# Patient Record
Sex: Male | Born: 1950
Health system: Southern US, Community
[De-identification: ages and names within clinical notes are randomized; demographics above are authoritative.]

## PROBLEM LIST (undated history)

## (undated) DIAGNOSIS — I10 Essential (primary) hypertension: Secondary | ICD-10-CM

## (undated) DIAGNOSIS — M1990 Unspecified osteoarthritis, unspecified site: Secondary | ICD-10-CM

## (undated) DIAGNOSIS — R000 Tachycardia, unspecified: Secondary | ICD-10-CM

## (undated) HISTORY — PX: KNEE SURGERY: SHX244

## (undated) HISTORY — PX: APPENDECTOMY: SHX54

---

## 2017-06-27 ENCOUNTER — Encounter (HOSPITAL_BASED_OUTPATIENT_CLINIC_OR_DEPARTMENT_OTHER): Payer: Self-pay | Admitting: *Deleted

## 2017-06-27 ENCOUNTER — Other Ambulatory Visit: Payer: Self-pay

## 2017-06-27 ENCOUNTER — Emergency Department (HOSPITAL_BASED_OUTPATIENT_CLINIC_OR_DEPARTMENT_OTHER)
Admission: EM | Admit: 2017-06-27 | Discharge: 2017-06-27 | Disposition: A | Discharge status: Home / Self Care | Payer: Medicare HMO | Attending: Emergency Medicine | Admitting: Emergency Medicine

## 2017-06-27 DIAGNOSIS — I10 Essential (primary) hypertension: Secondary | ICD-10-CM | POA: Insufficient documentation

## 2017-06-27 DIAGNOSIS — Z79899 Other long term (current) drug therapy: Secondary | ICD-10-CM | POA: Diagnosis not present

## 2017-06-27 HISTORY — DX: Tachycardia, unspecified: R00.0

## 2017-06-27 NOTE — ED Triage Notes (Signed)
Patient states five days ago he went to see a Engineer, petroleumplastic surgeon about a left axillary cyst.  Was found to have a systolic blood pressure of 200.  States the surgeon would not perform the procedure.  States he currently does not have a PCP.  Continued to monitor his bp thru out the week and has decreased thru out the week.  Denies chest pain or sob.  B/P today 161/65.

## 2017-06-27 NOTE — ED Provider Notes (Signed)
[facility] EMERGENCY DEPARTMENT Provider Note   CSN: [id number] Arrival date & time: 06/27/17  1648     History   Chief Complaint Chief Complaint  Patient presents with  . Hypertension    HPI James Key is a 66 y.o. male.  Patient is a 66 year old male who presents with high blood pressure.  He states that he was seen in a plastic surgeon's office to have a cyst removal for his hidradenitis.  At that point his blood pressure was in the 200 range.  He states he came in here to have it rechecked.  The plastic surgeon would not clear him for surgery until he had it evaluated.  He does have a history of elevated blood pressures.  He states his blood pressure normally runs in the 150 range systolically.  He denies any symptoms.  No chest pain, no shortness of breath, no headaches, no dizziness.  He is [place] by [facility].  He states that he has sometimes palpitations for which she takes Cardizem.  He does not know if this is atrial fibrillation.  He states he has been taking his Cardizem regularly.  He feels like he also takes it for his blood pressure.  He has not changed his dose recently.  He has not missed any doses.      Past Medical History:  Diagnosis Date  . Sinus tachycardia     There are no active problems to display for this patient.   Past Surgical History:  Procedure Laterality Date  . APPENDECTOMY         Home Medications    Prior to Admission medications   Medication Sig Start Date End Date Taking? Authorizing Provider  diltiazem (CARDIZEM CD) 360 MG 24 hr capsule Take 360 mg by mouth daily.   Yes [provider]    Family History No family history on file.  Social History Social History   Tobacco Use  . Smoking status: Never Smoker  . Smokeless tobacco: Never Used  Substance Use Topics  . Alcohol use: No    Frequency: Never  . Drug use: No     Allergies   Patient has no known  allergies.   Review of Systems Review of Systems  Constitutional: Negative for chills, diaphoresis, fatigue and fever.  HENT: Negative for congestion, rhinorrhea and sneezing.   Eyes: Negative.   Respiratory: Negative for cough, chest tightness and shortness of breath.   Cardiovascular: Negative for chest pain and leg swelling.  Gastrointestinal: Negative for abdominal pain, blood in stool, diarrhea, nausea and vomiting.  Genitourinary: Negative for difficulty urinating, flank pain, frequency and hematuria.  Musculoskeletal: Negative for arthralgias and back pain.  Skin: Negative for rash.  Neurological: Negative for dizziness, speech difficulty, weakness, numbness and headaches.     Physical Exam Updated Vital Signs BP (!) 156/65 (BP Location: Right Arm)   Pulse 70   Temp 97.9 F (36.6 C) (Oral)   Resp 18   Ht 6' (1.829 m)   Wt 106.6 kg (235 lb)   SpO2 100%   BMI 31.87 kg/m   Physical Exam  Constitutional: He is oriented to person, place, and time. He appears well-developed and well-nourished.  HENT:  Head: Normocephalic and atraumatic.  Eyes: Pupils are equal, round, and reactive to light.  Neck: Normal range of motion. Neck supple.  Cardiovascular: Normal rate, regular rhythm and normal heart sounds.  Pulmonary/Chest: Effort normal and breath sounds normal. No respiratory distress. He has  no wheezes. He has no rales. He exhibits no tenderness.  Abdominal: Soft. Bowel sounds are normal. There is no tenderness. There is no rebound and no guarding.  Musculoskeletal: Normal range of motion. He exhibits no edema.  Lymphadenopathy:    He has no cervical adenopathy.  Neurological: He is alert and oriented to person, place, and time.  Skin: Skin is warm and dry. No rash noted.  Psychiatric: He has a normal mood and affect.     ED Treatments / Results  Labs (all labs ordered are listed, but only abnormal results are displayed) Labs Reviewed - No data to display  EKG   EKG Interpretation  Date/Time:  Saturday June 27 2017 19:13:52 EST Ventricular Rate:  64 PR Interval:    QRS Duration: 93 QT Interval:  393 QTC Calculation: 406 R Axis:   -36 Text Interpretation:  Sinus rhythm Left axis deviation No old tracing to compare Confirmed by Rolan BuccoBelfi, Dareon Nunziato [phone]([id number]) on 06/27/2017 7:33:56 PM       Radiology No results found.  Procedures Procedures (including critical care time)  Medications Ordered in ED Medications - No data to display   Initial Impression / Assessment and Plan / ED Course  I have reviewed the triage vital signs and the nursing notes.  Pertinent labs & imaging results that were available during my care of the patient were reviewed by me and considered in my medical decision making (see chart for details).     Patient has symptomatic hypertension.  His blood pressure has improved in the emergency department.  His EKG is non-concerning.  I encouraged him to follow-up with his cardiologist next week.  Return precautions were given.  Final Clinical Impressions(s) / ED Diagnoses   Final diagnoses:  Hypertension, unspecified type    ED Discharge Orders    None       Rolan BuccoBelfi, Neziah Vogelgesang, MD 06/27/17 [phone]

## 2017-06-27 NOTE — ED Notes (Signed)
Pt on cardiac monitor and auto VS 

## 2017-06-27 NOTE — ED Notes (Signed)
Pt on monitor when entered room

## 2017-06-29 ENCOUNTER — Ambulatory Visit (HOSPITAL_BASED_OUTPATIENT_CLINIC_OR_DEPARTMENT_OTHER)
Admission: RE | Admit: 2017-06-29 | Payer: Commercial Managed Care - HMO | Source: Ambulatory Visit | Admitting: Specialist

## 2017-06-29 ENCOUNTER — Encounter (HOSPITAL_BASED_OUTPATIENT_CLINIC_OR_DEPARTMENT_OTHER): Admission: RE | Payer: Self-pay | Source: Ambulatory Visit

## 2017-06-29 SURGERY — EXCISION, HIDRADENITIS, AXILLA
Anesthesia: General | Laterality: Left

## 2017-07-20 ENCOUNTER — Emergency Department (HOSPITAL_BASED_OUTPATIENT_CLINIC_OR_DEPARTMENT_OTHER)
Admission: EM | Admit: 2017-07-20 | Discharge: 2017-07-20 | Disposition: A | Discharge status: Home / Self Care | Payer: Medicare HMO | Attending: Emergency Medicine | Admitting: Emergency Medicine

## 2017-07-20 ENCOUNTER — Encounter (HOSPITAL_BASED_OUTPATIENT_CLINIC_OR_DEPARTMENT_OTHER): Payer: Self-pay | Admitting: Adult Health

## 2017-07-20 ENCOUNTER — Emergency Department (HOSPITAL_BASED_OUTPATIENT_CLINIC_OR_DEPARTMENT_OTHER): Payer: Medicare HMO

## 2017-07-20 ENCOUNTER — Other Ambulatory Visit: Payer: Self-pay

## 2017-07-20 DIAGNOSIS — W108XXA Fall (on) (from) other stairs and steps, initial encounter: Secondary | ICD-10-CM | POA: Diagnosis not present

## 2017-07-20 DIAGNOSIS — S8991XA Unspecified injury of right lower leg, initial encounter: Secondary | ICD-10-CM | POA: Diagnosis present

## 2017-07-20 DIAGNOSIS — S8391XA Sprain of unspecified site of right knee, initial encounter: Secondary | ICD-10-CM | POA: Diagnosis not present

## 2017-07-20 DIAGNOSIS — Y999 Unspecified external cause status: Secondary | ICD-10-CM | POA: Diagnosis not present

## 2017-07-20 DIAGNOSIS — Z79899 Other long term (current) drug therapy: Secondary | ICD-10-CM | POA: Insufficient documentation

## 2017-07-20 DIAGNOSIS — Y9389 Activity, other specified: Secondary | ICD-10-CM | POA: Diagnosis not present

## 2017-07-20 DIAGNOSIS — Y92008 Other place in unspecified non-institutional (private) residence as the place of occurrence of the external cause: Secondary | ICD-10-CM | POA: Insufficient documentation

## 2017-07-20 DIAGNOSIS — M25561 Pain in right knee: Secondary | ICD-10-CM

## 2017-07-20 MED ORDER — NAPROXEN 500 MG PO TABS: 500.0000 mg | ORAL_TABLET | Freq: Two times a day (BID) | ORAL | 0 refills | Status: DC

## 2017-07-20 NOTE — Discharge Instructions (Signed)
Please read attached information regarding your condition. Wear knee sleeve as directed. Take naproxen as needed for pain and inflammation. Follow-up with orthopedic specialist for further evaluation. Return to ED for worsening symptoms, red hot or tender joint, numbness in legs, additional falls or injuries.

## 2017-07-20 NOTE — ED Provider Notes (Signed)
[facility] EMERGENCY DEPARTMENT Provider Note   CSN: [id number] Arrival date & time: 07/20/17  1503     History   Chief Complaint Chief Complaint  Patient presents with  . Knee Injury    HPI James Key is a 66 y.o. male who presents to ED for evaluation of 3-hour history of aching, cramping right knee pain.  He states that he tripped over 1 of the steps going up his house when he fell and landed on his right knee.  He has been amatory with pain since the accident as though the knee is having a cramping sensation.  He denies any head injury or loss of consciousness or other injuries.  He is not taking any medications prior to arrival.  No previous fracture, dislocations or procedures in the area.  He denies any fevers, numbness in legs, back pain, red hot or tender joint.  HPI  Past Medical History:  Diagnosis Date  . Sinus tachycardia     There are no active problems to display for this patient.   Past Surgical History:  Procedure Laterality Date  . APPENDECTOMY         Home Medications    Prior to Admission medications   Medication Sig Start Date End Date Taking? Authorizing Provider  diltiazem (CARDIZEM CD) 360 MG 24 hr capsule Take 360 mg by mouth daily.    [provider]  naproxen (NAPROSYN) 500 MG tablet Take 1 tablet (500 mg total) by mouth 2 (two) times daily. 07/20/17   Dietrich PatesKhatri, Susa Bones, PA-C    Family History History reviewed. No pertinent family history.  Social History Social History   Tobacco Use  . Smoking status: Never Smoker  . Smokeless tobacco: Never Used  Substance Use Topics  . Alcohol use: No    Frequency: Never  . Drug use: No     Allergies   Patient has no known allergies.   Review of Systems Review of Systems  Constitutional: Negative for chills and fever.  Eyes: Negative for visual disturbance.  Musculoskeletal: Positive for arthralgias and gait problem. Negative for joint swelling, myalgias and neck  stiffness.  Skin: Negative for rash and wound.  Neurological: Negative for weakness, numbness and headaches.     Physical Exam Updated Vital Signs BP 139/67 (BP Location: Right Arm)   Pulse 68   Temp 98.1 F (36.7 C) (Oral)   Resp 18   Ht 6' (1.829 m)   Wt 106.6 kg (235 lb)   SpO2 100%   BMI 31.87 kg/m   Physical Exam  Constitutional: He appears well-developed and well-nourished. No distress.  HENT:  Head: Normocephalic and atraumatic.  Eyes: Conjunctivae and EOM are normal. No scleral icterus.  Neck: Normal range of motion.  Pulmonary/Chest: Effort normal. No respiratory distress.  Musculoskeletal: Normal range of motion. He exhibits tenderness. He exhibits no edema or deformity.  Tenderness to palpation above the right patella with no visible deformity, color or temperature change noted.  2+ DP pulse noted bilaterally.  Full active and passive range of motion of knee but pain with flexion of the knee.  Able to perform straight leg raise.  Neurological: He is alert.  Skin: No rash noted. He is not diaphoretic.  Psychiatric: He has a normal mood and affect.  Nursing note and vitals reviewed.    ED Treatments / Results  Labs (all labs ordered are listed, but only abnormal results are displayed) Labs Reviewed - No data to display  EKG  EKG  Interpretation None       Radiology Dg Knee Complete 4 Views Right  Result Date: 07/20/2017 CLINICAL DATA:  Fall with pain above the patella and swelling EXAM: RIGHT KNEE - COMPLETE 4+ VIEW COMPARISON:  None. FINDINGS: No fracture or malalignment. No large effusion. Mild spurring of the patella. Nonspecific soft tissue calcifications superior to the patella. Minimal joint effusion. IMPRESSION: No definite acute osseous abnormality. Electronically Signed   By: Jasmine PangKim  Fujinaga M.D.   On: 07/20/2017 15:40    Procedures Procedures (including critical care time)  Medications Ordered in ED Medications - No data to  display   Initial Impression / Assessment and Plan / ED Course  I have reviewed the triage vital signs and the nursing notes.  Pertinent labs & imaging results that were available during my care of the patient were reviewed by me and considered in my medical decision making (see chart for details).     Patient presents to ED for evaluation of right knee pain after falling on his knee a few hours prior to arrival.  States that he tripped over 1 of the steps leading to his house.  He denies any head injury or loss of consciousness.  He is not on blood thinners.  No previous fracture, dislocation or procedure in the area.  There is mild tenderness to palpation noted above the patella with no visible deformity, color or temperature change.  He is overall well-appearing.  He is ambulatory with normal gait here in the ED.  X-ray returned as negative.  I suspect contusion being the cause of his pain or possible ligamentous injury.  I have low suspicion for septic joint or other infectious cause of his pain.  Will give patient knee sleeve, anti-inflammatories and advised him to follow-up with orthopedist for further evaluation.  Patient appears stable for discharge at this time.  Strict return precautions given.  Final Clinical Impressions(s) / ED Diagnoses   Final diagnoses:  Acute pain of right knee  Sprain of right knee, unspecified ligament, initial encounter    ED Discharge Orders        Ordered    naproxen (NAPROSYN) 500 MG tablet  2 times daily     07/20/17 1634     Portions of this note were generated with Dragon dictation software. Dictation errors may occur despite best attempts at proofreading.    Dietrich PatesKhatri, Diane Hanel, PA-C 07/20/17 1639    Maia PlanLong, Joshua G, MD 07/21/17 [phone]

## 2017-07-20 NOTE — ED Triage Notes (Signed)
PResents with right knee and upper leg pain that began after tripping over a step today and landing hands first on concrete. Denies hip pain, pain is described as sore and cramping. He has had trouble extending knee without pain. Pulses intact. No deformity noted.

## 2017-07-20 NOTE — ED Notes (Signed)
ED Provider at bedside. 

## 2019-11-23 ENCOUNTER — Encounter (HOSPITAL_BASED_OUTPATIENT_CLINIC_OR_DEPARTMENT_OTHER): Payer: Self-pay

## 2019-11-23 ENCOUNTER — Other Ambulatory Visit: Payer: Self-pay

## 2019-11-23 ENCOUNTER — Emergency Department (HOSPITAL_BASED_OUTPATIENT_CLINIC_OR_DEPARTMENT_OTHER)
Admission: EM | Admit: 2019-11-23 | Discharge: 2019-11-23 | Disposition: A | Discharge status: Home / Self Care | Payer: Medicare HMO | Attending: Emergency Medicine | Admitting: Emergency Medicine

## 2019-11-23 DIAGNOSIS — I10 Essential (primary) hypertension: Secondary | ICD-10-CM | POA: Insufficient documentation

## 2019-11-23 DIAGNOSIS — Z79899 Other long term (current) drug therapy: Secondary | ICD-10-CM | POA: Insufficient documentation

## 2019-11-23 DIAGNOSIS — R030 Elevated blood-pressure reading, without diagnosis of hypertension: Secondary | ICD-10-CM

## 2019-11-23 DIAGNOSIS — M79674 Pain in right toe(s): Secondary | ICD-10-CM | POA: Insufficient documentation

## 2019-11-23 HISTORY — DX: Unspecified osteoarthritis, unspecified site: M19.90

## 2019-11-23 HISTORY — DX: Essential (primary) hypertension: I10

## 2019-11-23 MED ORDER — NAPROXEN 500 MG PO TABS: 500.0000 mg | ORAL_TABLET | Freq: Two times a day (BID) | ORAL | 0 refills | Status: AC

## 2019-11-23 NOTE — Discharge Instructions (Addendum)
I have prescribed NSAIDS to help with pain, please take 1 tablet twice a day with food to help with your symptoms.   Continue to follow up with Podiatry for your chronic right toe pain.   Follow up with your primary care physician as needed.

## 2019-11-23 NOTE — ED Triage Notes (Addendum)
Pt c/o elevated BP x 1 week-pt states he is med compliant-concerned that increase pain in "right foot big toe" that he is being seen for is contributing to increase in BP-NAD-steady gait-during med hx review pt states he thinks he was dx with arthritis to right great toe

## 2019-11-23 NOTE — ED Notes (Signed)
ED Provider at bedside. 

## 2019-11-23 NOTE — ED Provider Notes (Signed)
[facility] EMERGENCY DEPARTMENT Provider Note   CSN: [id number] Arrival date & time: 11/23/19  1312     History Chief Complaint  Patient presents with  . Hypertension    James Key is a 69 y.o. male.  69 y.o male with a PMH of HTN presents to the ED with a chief complaint of elevated BP and right toe pain x months.  Patient reports he is noted his blood pressure to be slightly elevated at home with a systolic in the 762G, states that he is compliant with his current medication.  He has also been dealing with right great toe pain, which she was previously evaluated by podiatry.  Reports this pain feels like a constant ache, wakes him up in the morning along with exacerbated with lying flat.  He was previously seen by podiatry, was given some inserts but has not had any relieving symptoms.  He has also tried on occasion for his pain without improvement in symptoms.  He does not have any prior history of gout, denies any fever, falls, no chest pain, shortness of breath or headaches.   The history is provided by the patient.  Hypertension       Past Medical History:  Diagnosis Date  . Arthritis   . Hypertension   . Sinus tachycardia     There are no problems to display for this patient.   Past Surgical History:  Procedure Laterality Date  . APPENDECTOMY         No family history on file.  Social History   Tobacco Use  . Smoking status: Never Smoker  . Smokeless tobacco: Never Used  Substance Use Topics  . Alcohol use: No  . Drug use: No    Home Medications Prior to Admission medications   Medication Sig Start Date End Date Taking? Authorizing Provider  diltiazem (CARDIZEM CD) 360 MG 24 hr capsule Take 360 mg by mouth daily.    [provider]  naproxen (NAPROSYN) 500 MG tablet Take 1 tablet (500 mg total) by mouth 2 (two) times daily for 7 days. 11/23/19 11/30/19  Janeece Fitting, PA-C    Allergies    Patient has no known  allergies.  Review of Systems   Review of Systems  Constitutional: Negative for fever.  Musculoskeletal: Positive for arthralgias.    Physical Exam Updated Vital Signs BP (!) 153/63   Pulse 80   Temp 98.3 F (36.8 C) (Oral)   Resp 18   Ht 6' (1.829 m)   Wt 106.1 kg   SpO2 100%   BMI 31.74 kg/m   Physical Exam Vitals and nursing note reviewed.  Constitutional:      Appearance: Normal appearance.  HENT:     Head: Normocephalic and atraumatic.     Nose: Nose normal.     Mouth/Throat:     Mouth: Mucous membranes are moist.  Eyes:     Pupils: Pupils are equal, round, and reactive to light.  Cardiovascular:     Rate and Rhythm: Normal rate.     Pulses:          Dorsalis pedis pulses are 2+ on the right side.       Posterior tibial pulses are 2+ on the right side.  Pulmonary:     Effort: Pulmonary effort is normal.  Abdominal:     General: Abdomen is flat.  Musculoskeletal:        General: Tenderness present.     Cervical back: Normal range of  motion and neck supple.  Feet:     Right foot:     Skin integrity: Callus present. No ulcer, blister, skin breakdown, erythema, warmth, dry skin or fissure.     Toenail Condition: Right toenails are long.     Comments: Pulses present capillary refill is intact, pain at the base of the first metatarsal base.  No erythema, edema, sensation is intact. Skin:    General: Skin is warm and dry.  Neurological:     Mental Status: He is alert and oriented to person, place, and time.     ED Results / Procedures / Treatments   Labs (all labs ordered are listed, but only abnormal results are displayed) Labs Reviewed - No data to display  EKG None  Radiology No results found.  Procedures Procedures (including critical care time)  Medications Ordered in ED Medications - No data to display  ED Course  I have reviewed the triage vital signs and the nursing notes.  Pertinent labs & imaging results that were available during my  care of the patient were reviewed by me and considered in my medical decision making (see chart for details).    MDM Rules/Calculators/A&P   Patient with a past medical history of high blood pressure presents to the ED with complaints of elevated blood pressure along with right great toe pain for several months.  Patient reports he was previously seen by podiatry, received new inserts, states he is have not help with his symptoms, reports his blood pressure has been running with a systolic in the 170s, diastolic level is within normal limits.  He reports compliant with his medication.  According to his review he has seen podiatry approximately 6 days ago, received new inserts, he was diagnosed with hallux rigidus and hallux valgus deformity.  He had podiatry discussed nonoperative and operative treatment.  He is to be reevaluated within 3 months if inserts fail.  We discussed that he will likely need to have his right toe followed by podiatry.  There is no suspicion for gout, no prior history of this, no history of beer consumption, does report meat intake but believes this is less likely.  No trauma, falls.  Blood pressure in the ED is 178/72, no chest pain, shortness of breath, headaches.  Discussed with patient to go home on a short course of anti-inflammatories to help with his toe pain, he is to continue to follow-up with podiatry.  Symptoms no recent management, return precautions discussed at length.    Portions of this note were generated with Scientist, clinical (histocompatibility and immunogenetics). Dictation errors may occur despite best attempts at proofreading.  Final Clinical Impression(s) / ED Diagnoses Final diagnoses:  Elevated blood pressure reading  Great toe pain, right    Rx / DC Orders ED Discharge Orders         Ordered    naproxen (NAPROSYN) 500 MG tablet  2 times daily     11/23/19 1357           Claude Manges, PA-C 11/23/19 1401    Linwood Dibbles, MD 11/23/19 1437

## 2020-01-24 ENCOUNTER — Other Ambulatory Visit: Payer: Self-pay

## 2020-01-24 ENCOUNTER — Emergency Department (HOSPITAL_BASED_OUTPATIENT_CLINIC_OR_DEPARTMENT_OTHER): Payer: Medicare HMO

## 2020-01-24 ENCOUNTER — Emergency Department (HOSPITAL_BASED_OUTPATIENT_CLINIC_OR_DEPARTMENT_OTHER)
Admission: EM | Admit: 2020-01-24 | Discharge: 2020-01-24 | Disposition: A | Discharge status: Home / Self Care | Payer: Medicare HMO | Attending: Emergency Medicine | Admitting: Emergency Medicine

## 2020-01-24 ENCOUNTER — Encounter (HOSPITAL_BASED_OUTPATIENT_CLINIC_OR_DEPARTMENT_OTHER): Payer: Self-pay | Admitting: *Deleted

## 2020-01-24 DIAGNOSIS — M2550 Pain in unspecified joint: Secondary | ICD-10-CM | POA: Insufficient documentation

## 2020-01-24 DIAGNOSIS — I10 Essential (primary) hypertension: Secondary | ICD-10-CM | POA: Diagnosis not present

## 2020-01-24 DIAGNOSIS — M79674 Pain in right toe(s): Secondary | ICD-10-CM | POA: Diagnosis present

## 2020-01-24 MED ORDER — PREDNISONE 20 MG PO TABS: 40.0000 mg | ORAL_TABLET | Freq: Every day | ORAL | 0 refills | Status: AC

## 2020-01-24 MED ORDER — HYDROCODONE-ACETAMINOPHEN 5-325 MG PO TABS: 1.0000 | ORAL_TABLET | ORAL | 0 refills | Status: AC | PRN

## 2020-01-24 MED FILL — HYDROCODON-APAP 5-325: 5-325 | 1 days supply | Qty: 5 | Fill #0

## 2020-01-24 MED FILL — predniSONE 20 MG TABS: 20 | 5 days supply | Qty: 10 | Fill #0

## 2020-01-24 NOTE — ED Provider Notes (Signed)
[facility] EMERGENCY DEPARTMENT Provider Note   CSN: [id number] Arrival date & time: 01/24/20  1152     History Chief Complaint  Patient presents with  . Toe Pain  . Hypertension    James Key is a 69 y.o. male.  69 y.o male with a PMH of HTN, Arthritis presents to the ED with a chief complaint of right great toe pain x "long time ago". Patient is followed by podiatry for his hallux valgus, he reports throbbing sensation to his right great toe, this is constant.  Worsening while he is lying in bed but improves with ambulation throughout the day.  He reports he has been following up with podiatry with new inserts however pain has not improved.  Does admit to eating more red meat lately, no prior history of gout.  There has been no fevers, trauma, falls, other complaints.   The history is provided by the patient.       Past Medical History:  Diagnosis Date  . Arthritis   . Hypertension   . Sinus tachycardia     There are no problems to display for this patient.   Past Surgical History:  Procedure Laterality Date  . APPENDECTOMY         No family history on file.  Social History   Tobacco Use  . Smoking status: Never Smoker  . Smokeless tobacco: Never Used  Vaping Use  . Vaping Use: Never used  Substance Use Topics  . Alcohol use: No  . Drug use: No    Home Medications Prior to Admission medications   Medication Sig Start Date End Date Taking? Authorizing Provider  diltiazem (CARDIZEM CD) 360 MG 24 hr capsule Take 360 mg by mouth daily.    [provider]  HYDROcodone-acetaminophen (NORCO/VICODIN) 5-325 MG tablet Take 1 tablet by mouth every 4 (four) hours as needed for up to 3 days. 01/24/20 01/27/20  Claude Manges, PA-C  predniSONE (DELTASONE) 20 MG tablet Take 2 tablets (40 mg total) by mouth daily for 5 days. 01/24/20 01/29/20  Claude Manges, PA-C    Allergies    Patient has no known allergies.  Review of Systems   Review of  Systems  Constitutional: Negative for fever.  Musculoskeletal: Positive for arthralgias.    Physical Exam Updated Vital Signs BP (!) 177/66   Pulse 85   Temp 98.1 F (36.7 C) (Oral)   Resp 16   Ht 6' (1.829 m)   Wt 104.3 kg   SpO2 100%   BMI 31.19 kg/m   Physical Exam Vitals and nursing note reviewed.  Constitutional:      Appearance: Normal appearance.  HENT:     Head: Normocephalic and atraumatic.     Nose: Nose normal.  Eyes:     Pupils: Pupils are equal, round, and reactive to light.  Cardiovascular:     Rate and Rhythm: Normal rate.     Pulses:          Dorsalis pedis pulses are 2+ on the right side and 2+ on the left side.       Posterior tibial pulses are 2+ on the right side and 2+ on the left side.  Pulmonary:     Effort: Pulmonary effort is normal.  Abdominal:     General: Abdomen is flat.  Musculoskeletal:     Cervical back: Normal range of motion and neck supple.     Right foot: Normal range of motion and normal capillary refill. Bunion and  tenderness present. No swelling, deformity, Charcot foot, foot drop, prominent metatarsal heads, laceration or bony tenderness. Normal pulse.  Feet:     Right foot:     Skin integrity: Skin integrity normal.     Toenail Condition: Right toenails are normal.     Comments: Pulses present, capillary refill is normal, sensation is intact. No erythema, edema or changes in the skin noted.  Skin:    General: Skin is warm and dry.  Neurological:     Mental Status: He is alert and oriented to person, place, and time.     ED Results / Procedures / Treatments   Labs (all labs ordered are listed, but only abnormal results are displayed) Labs Reviewed - No data to display  EKG None  Radiology DG Toe Great Right  Result Date: 01/24/2020 CLINICAL DATA:  Pain EXAM: RIGHT FIRST TOE: 3 V COMPARISON:  None. FINDINGS: Frontal, oblique, and lateral views were obtained. No fracture or dislocation. There is moderate narrowing of  the first MTP joint. There is a small erosion along the distal first metatarsal. There is immediately adjacent calcification in this area. No other erosions. Note that there is mild hallux valgus deformity first MTP joint. IMPRESSION: No fracture or dislocation. Narrowing first MTP joint with small erosion along the medial distal first metatarsal. Question underlying gout. Appropriate laboratory correlation in this regard advised. Mild hallux valgus deformity at first MTP joint. Electronically Signed   By: Bretta Bang III M.D.   On: 01/24/2020 12:40    Procedures Procedures (including critical care time)  Medications Ordered in ED Medications - No data to display  ED Course  I have reviewed the triage vital signs and the nursing notes.  Pertinent labs & imaging results that were available during my care of the patient were reviewed by me and considered in my medical decision making (see chart for details).    MDM Rules/Calculators/A&P   Patient with no pertinent past medical history presents to the ED with a chief complaint of right great toe pain that has been ongoing.  Previously followed by podiatry, has an appointment scheduled with them at the end of the month.  Reports he has been taking Tylenol for the pain.  Describes a throbbing sensation to his right great toe, occurs more so at night but improved with ambulation.  Is a seen in the ED for the same complaint, has had no fevers, trauma, weakness.Xray of his right foot was obtained which showed: No fracture or dislocation. Narrowing first MTP joint with small  erosion along the medial distal first metatarsal. Question  underlying gout. Appropriate laboratory correlation in this regard  advised.    Mild hallux valgus deformity at first MTP joint.     No prior history of gout per patient, has been eating steak lately but does not consume any alcohol. He denies any prior history of gout. Joint does not appear swollen,  erythematous or with decrease in mobility.  I have a lower suspicion for gout, was last treated with naproxen which reports did not improve his symptoms.  He will be placed on a short course of steroids to help with symptomatic treatment.  He is not diabetic.  He was provided with a short course of Norco to help with symptoms.  Advised to decrease his steak intake.  Patient is following up with podiatry at the end of the month. No changes in skin consistent with cellulitis.   He is concern about BP control currently on  diltiazem 360 mg 24-hour capsule, reports measuring his blood pressure at home and having a systolic reading in the 170s, no chest pain, shortness of breath, headache.  Patient reports he feels that the pain is likely increasing his blood pressure.  We discussed symptomatic treatment along with following up with PCP for blood pressure measurements.  Return precautions discussed at length.   Portions of this note were generated with Scientist, clinical (histocompatibility and immunogenetics). Dictation errors may occur despite best attempts at proofreading.  Final Clinical Impression(s) / ED Diagnoses Final diagnoses:  Great toe pain, right    Rx / DC Orders ED Discharge Orders         Ordered    predniSONE (DELTASONE) 20 MG tablet  Daily     Discontinue  Reprint     01/24/20 1343    HYDROcodone-acetaminophen (NORCO/VICODIN) 5-325 MG tablet  Every 4 hours PRN     Discontinue  Reprint     01/24/20 1344           Claude Manges, PA-C 01/24/20 1350    Benjiman Core, MD 01/24/20 [phone]

## 2020-01-24 NOTE — ED Triage Notes (Signed)
Pain in his right great toe "for a long time".

## 2020-01-24 NOTE — Discharge Instructions (Signed)
Your x-ray today was within normal limits.  You may continue to follow-up with podiatry.  I have prescribed a short course of steroids to help with your pain, please take 2 tablets daily for the next 5 days.  I have also prescribed medication for pain, please only take this for severe pain.

## 2020-04-07 ENCOUNTER — Encounter (HOSPITAL_BASED_OUTPATIENT_CLINIC_OR_DEPARTMENT_OTHER): Payer: Self-pay

## 2020-04-07 ENCOUNTER — Emergency Department (HOSPITAL_BASED_OUTPATIENT_CLINIC_OR_DEPARTMENT_OTHER)
Admission: EM | Admit: 2020-04-07 | Discharge: 2020-04-07 | Disposition: A | Discharge status: Home / Self Care | Payer: No Typology Code available for payment source | Attending: Emergency Medicine | Admitting: Emergency Medicine

## 2020-04-07 ENCOUNTER — Other Ambulatory Visit: Payer: Self-pay

## 2020-04-07 DIAGNOSIS — S39012A Strain of muscle, fascia and tendon of lower back, initial encounter: Secondary | ICD-10-CM

## 2020-04-07 DIAGNOSIS — Y929 Unspecified place or not applicable: Secondary | ICD-10-CM | POA: Insufficient documentation

## 2020-04-07 DIAGNOSIS — I10 Essential (primary) hypertension: Secondary | ICD-10-CM | POA: Insufficient documentation

## 2020-04-07 DIAGNOSIS — Y998 Other external cause status: Secondary | ICD-10-CM | POA: Diagnosis not present

## 2020-04-07 DIAGNOSIS — M545 Low back pain: Secondary | ICD-10-CM | POA: Insufficient documentation

## 2020-04-07 DIAGNOSIS — Y939 Activity, unspecified: Secondary | ICD-10-CM | POA: Diagnosis not present

## 2020-04-07 MED ORDER — IBUPROFEN 400 MG PO TABS
400.0000 mg | ORAL_TABLET | Freq: Once | ORAL | Status: AC | PRN
  Administered 2020-04-07: 400 mg via ORAL
  Filled 2020-04-07: qty 1

## 2020-04-07 MED ORDER — CYCLOBENZAPRINE HCL 5 MG PO TABS: 5.0000 mg | ORAL_TABLET | Freq: Three times a day (TID) | ORAL | 0 refills | Status: AC | PRN
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
## 2020-04-07 NOTE — Discharge Instructions (Addendum)
Continue to take Motrin 600 mg every 6 hours as needed  Take Flexeril for muscle spasms.  See your doctor for follow-up  Expect to be stiff and sore for several days.  Return to ER if you have worse back pain, hip pain, trouble walking, headaches
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
## 2021-08-14 IMAGING — CR DG TOE GREAT 2+V*R*
3 series · 3 of 3 positions shown · non-contrast
Comparison: None.

CLINICAL DATA: Pain

EXAM:
RIGHT FIRST TOE: 3 V

[t toes ap right]
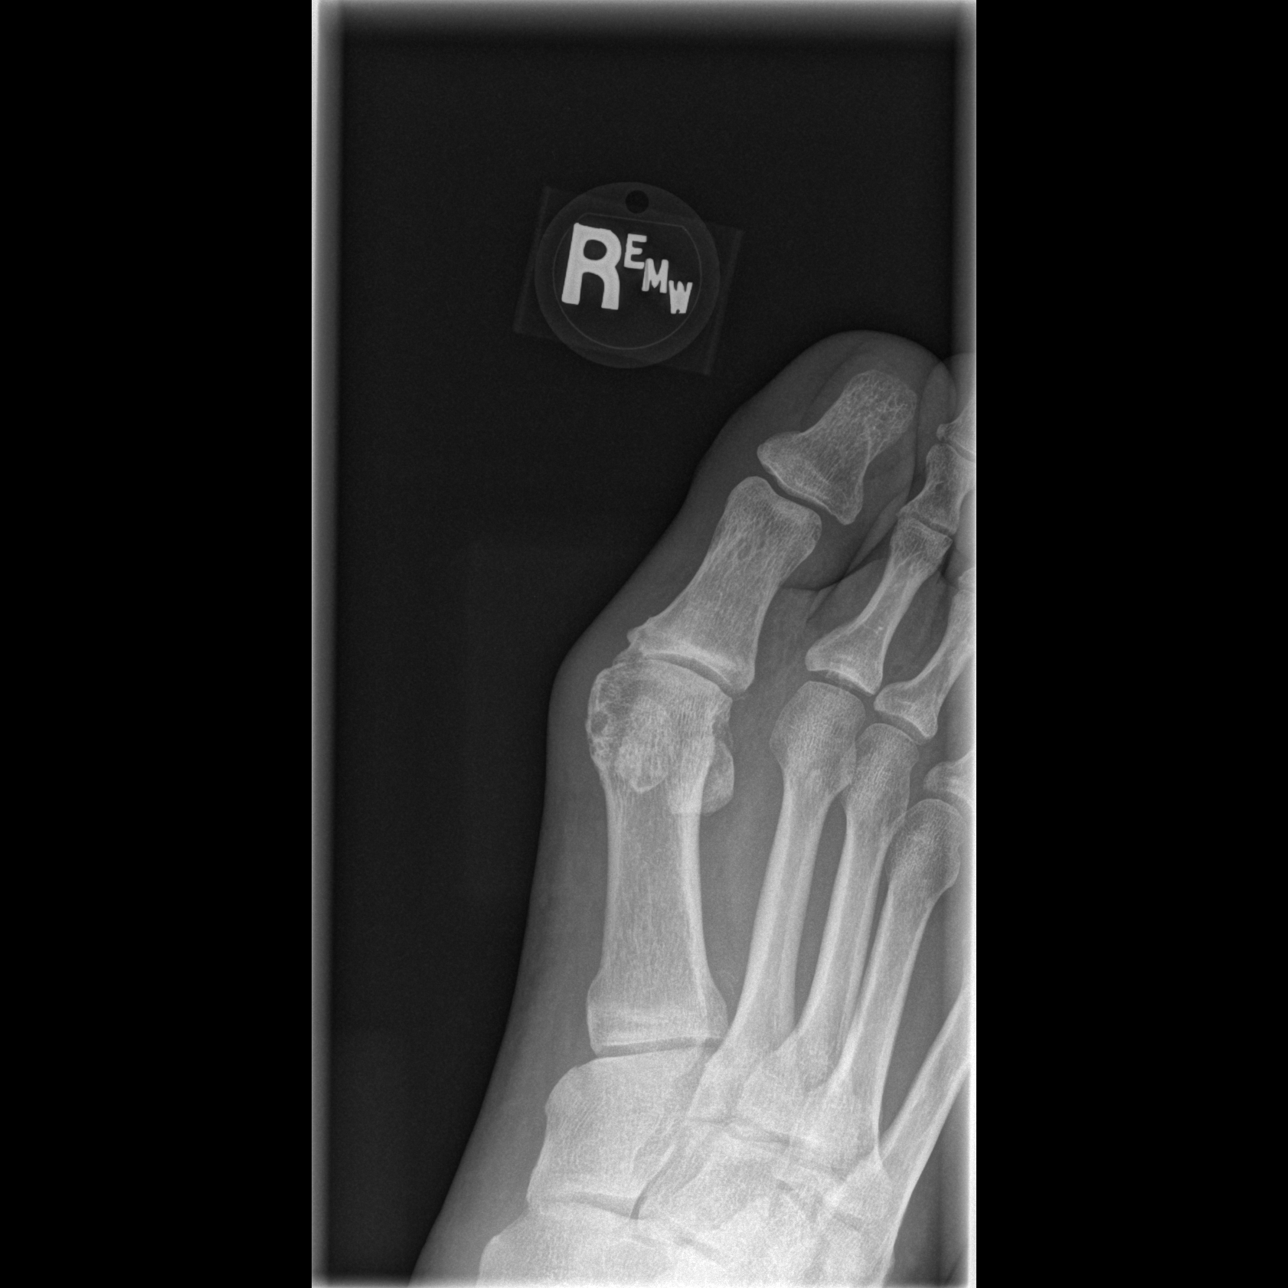

[t toes oblique right]
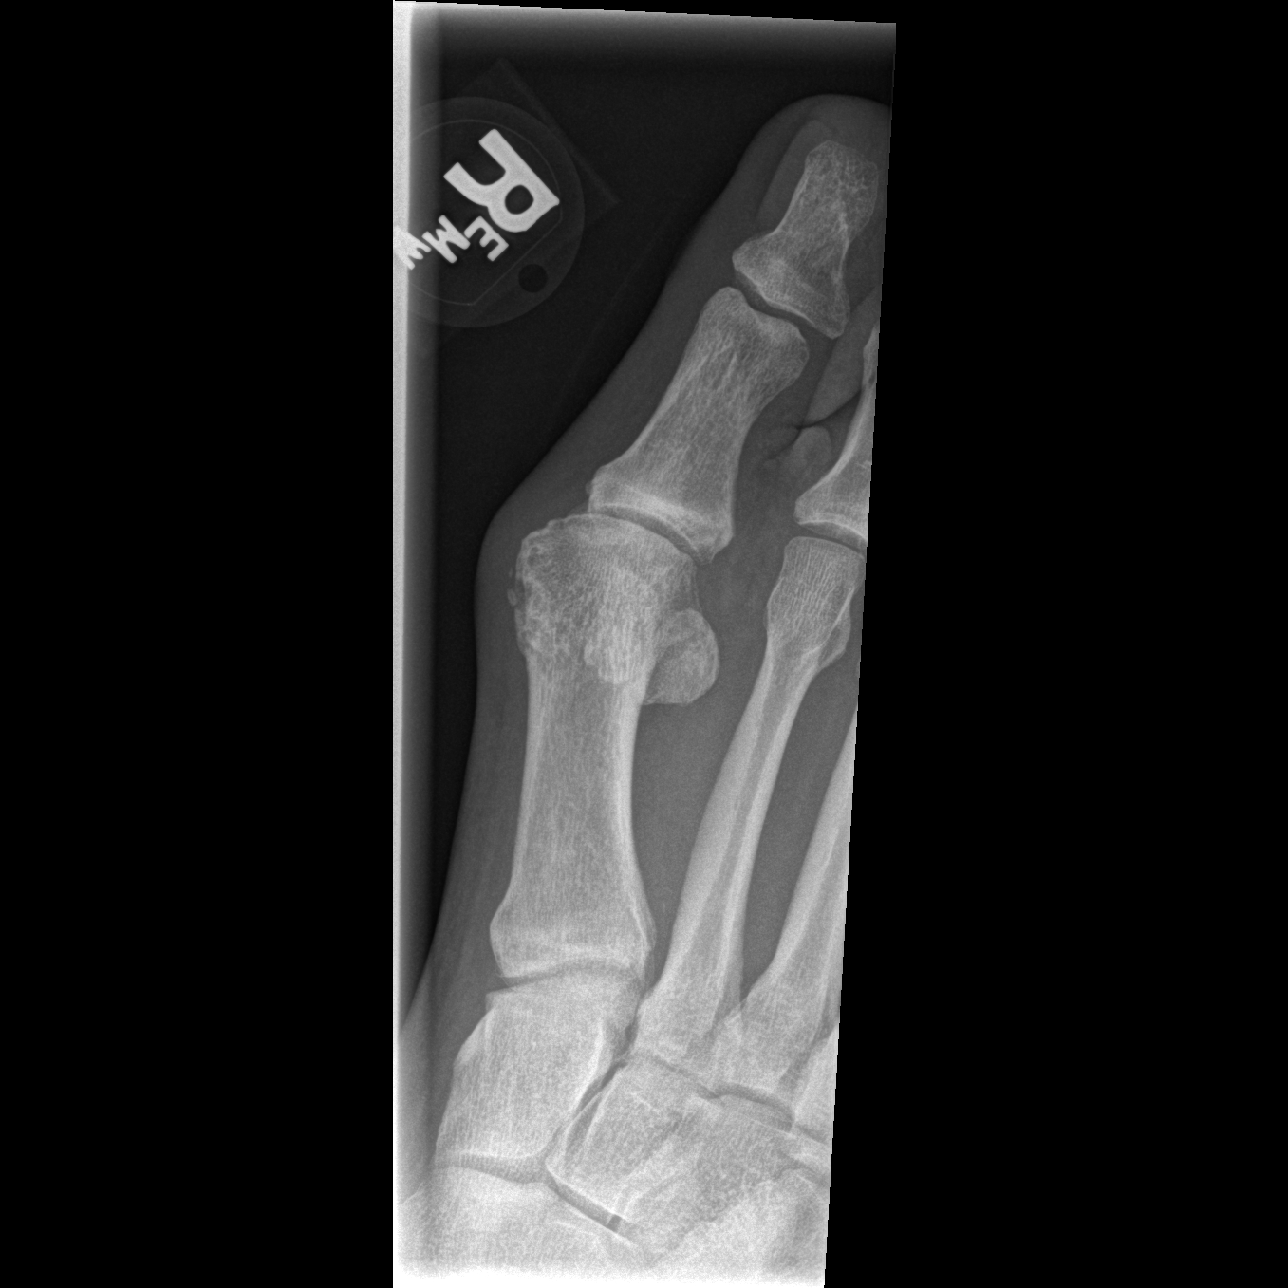

[t toes lateral right]
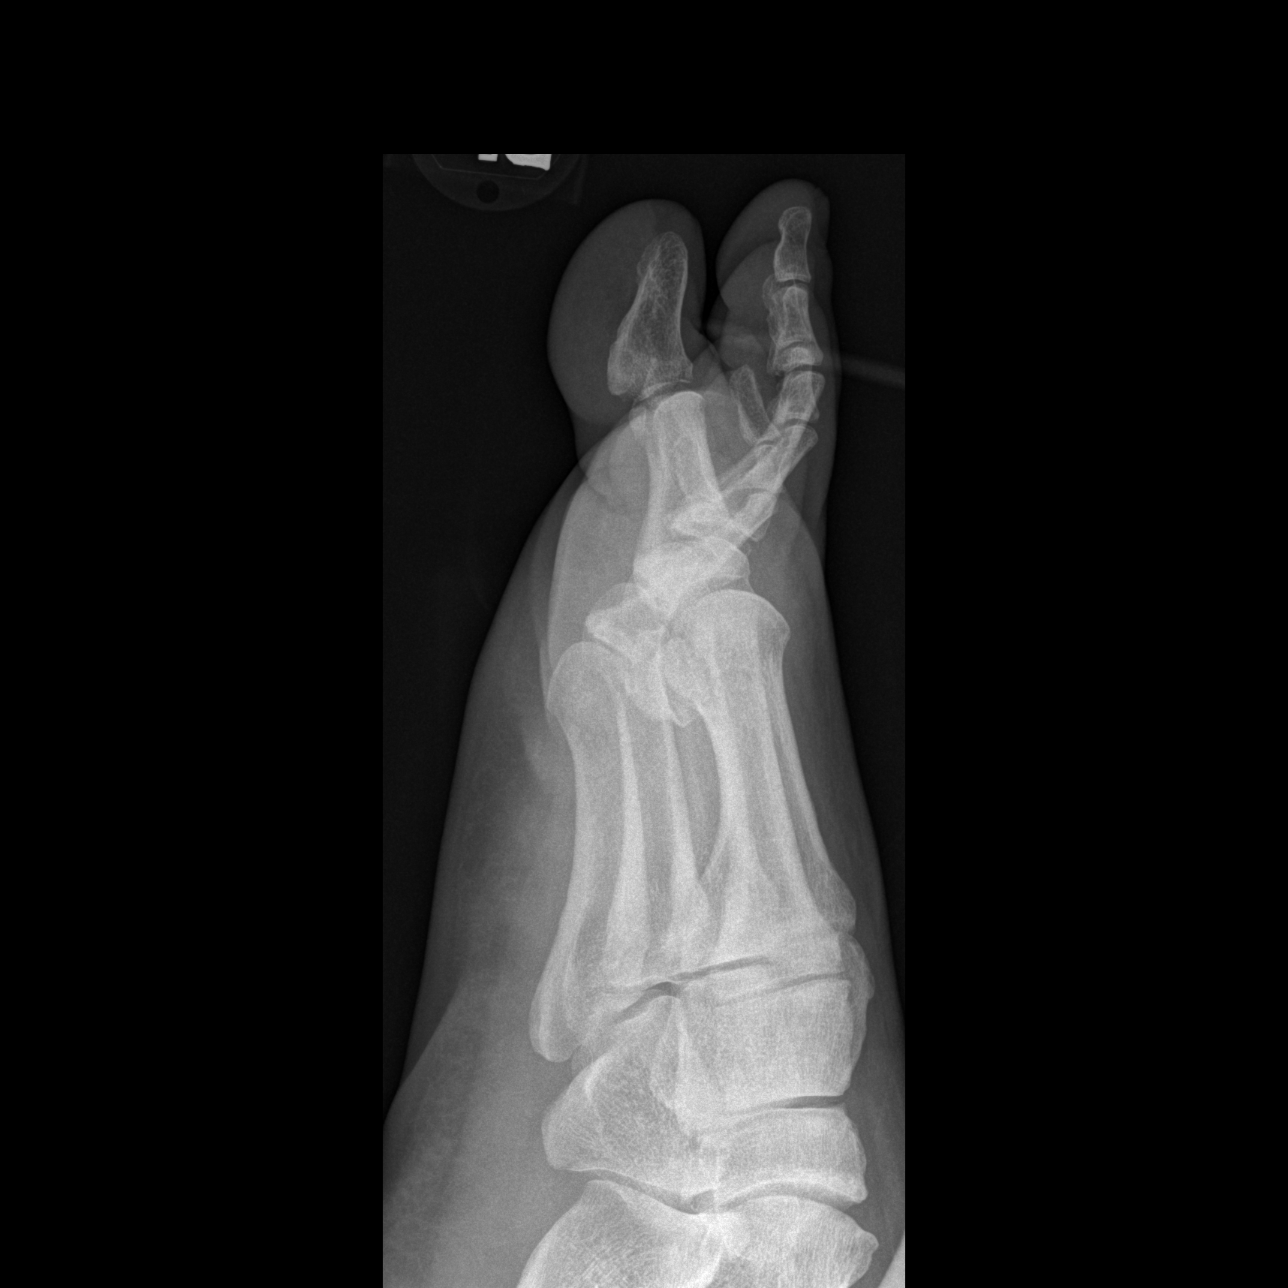

[3 of 3 positions shown; findings below may reference images not displayed]

FINDINGS: Frontal, oblique, and lateral views were obtained. No fracture or
dislocation. There is moderate narrowing of the first MTP joint.
There is a small erosion along the distal first metatarsal. There is
immediately adjacent calcification in this area. No other erosions.
Note that there is mild hallux valgus deformity first MTP joint.
IMPRESSION: No fracture or dislocation. Narrowing first MTP joint with small
erosion along the medial distal first metatarsal. Question
underlying gout. Appropriate laboratory correlation in this regard
advised.

Mild hallux valgus deformity at first MTP joint.

## 2022-05-08 ENCOUNTER — Emergency Department (HOSPITAL_BASED_OUTPATIENT_CLINIC_OR_DEPARTMENT_OTHER)
Admission: EM | Admit: 2022-05-08 | Discharge: 2022-05-08 | Disposition: A | Discharge status: Home / Self Care | Payer: Medicare HMO | Attending: Emergency Medicine | Admitting: Emergency Medicine

## 2022-05-08 ENCOUNTER — Other Ambulatory Visit: Payer: Self-pay

## 2022-05-08 ENCOUNTER — Emergency Department (HOSPITAL_BASED_OUTPATIENT_CLINIC_OR_DEPARTMENT_OTHER): Payer: Medicare HMO

## 2022-05-08 ENCOUNTER — Encounter (HOSPITAL_BASED_OUTPATIENT_CLINIC_OR_DEPARTMENT_OTHER): Payer: Self-pay | Admitting: Emergency Medicine

## 2022-05-08 DIAGNOSIS — R030 Elevated blood-pressure reading, without diagnosis of hypertension: Secondary | ICD-10-CM | POA: Diagnosis not present

## 2022-05-08 DIAGNOSIS — I10 Essential (primary) hypertension: Secondary | ICD-10-CM | POA: Insufficient documentation

## 2022-05-08 DIAGNOSIS — J40 Bronchitis, not specified as acute or chronic: Secondary | ICD-10-CM | POA: Insufficient documentation

## 2022-05-08 DIAGNOSIS — Z20822 Contact with and (suspected) exposure to covid-19: Secondary | ICD-10-CM | POA: Insufficient documentation

## 2022-05-08 DIAGNOSIS — R059 Cough, unspecified: Secondary | ICD-10-CM | POA: Diagnosis present

## 2022-05-08 DIAGNOSIS — Z79899 Other long term (current) drug therapy: Secondary | ICD-10-CM | POA: Insufficient documentation

## 2022-05-08 LAB — RESP PANEL BY RT-PCR (FLU A&B, COVID) ARPGX2
Influenza A by PCR: NEGATIVE
Influenza B by PCR: NEGATIVE
SARS Coronavirus 2 by RT PCR: NEGATIVE

## 2022-05-08 MED ORDER — AZITHROMYCIN 250 MG PO TABS: 250.0000 mg | ORAL_TABLET | Freq: Every day | ORAL | 0 refills | Status: AC

## 2022-05-08 MED ORDER — PREDNISONE 10 MG PO TABS: 40.0000 mg | ORAL_TABLET | Freq: Every day | ORAL | 0 refills | Status: AC

## 2022-05-08 MED ORDER — ALBUTEROL SULFATE HFA 108 (90 BASE) MCG/ACT IN AERS: 1.0000 | INHALATION_SPRAY | Freq: Four times a day (QID) | RESPIRATORY_TRACT | 0 refills | Status: AC | PRN

## 2022-05-08 NOTE — ED Triage Notes (Signed)
Cough x 4 days , nasal congestion . No fever .

## 2022-05-08 NOTE — ED Notes (Signed)
Pt ambulatory with steady gait to room 

## 2022-05-08 NOTE — ED Provider Notes (Signed)
[facility] EMERGENCY DEPARTMENT Provider Note   CSN: [id number] Arrival date & time: 05/08/22  1613     History  Chief Complaint  Patient presents with   URI    James Key is a 71 y.o. male.  71 year old male presents today for evaluation of 4-day duration of productive cough and rhinorrhea.  He denies fever, chills, chest pain, shortness of breath, sinus congestion, PND.  Past medical history significant for hypertension, SVT on Cardizem, and arthritis.  He primarily presented for evaluation to ensure he does not have COVID as he has grandkids that go to daycare and he does not want to be around anyone at discharge and passed off any viral illness.  The history is provided by the patient. No language interpreter was used.       Home Medications Prior to Admission medications   Medication Sig Start Date End Date Taking? Authorizing Provider  cyclobenzaprine (FLEXERIL) 5 MG tablet Take 1 tablet (5 mg total) by mouth 3 (three) times daily as needed for muscle spasms. 04/07/20   Charlynne Pander, MD  diltiazem (CARDIZEM CD) 360 MG 24 hr capsule Take 360 mg by mouth daily.    [provider]      Allergies    Patient has no known allergies.    Review of Systems   Review of Systems  Constitutional:  Negative for chills and fever.  HENT:  Positive for rhinorrhea. Negative for congestion, postnasal drip and sore throat.   Respiratory:  Positive for cough. Negative for shortness of breath.   Cardiovascular:  Negative for chest pain.  Neurological:  Negative for headaches.  All other systems reviewed and are negative.   Physical Exam Updated Vital Signs BP (!) 159/56 (BP Location: Right Arm)   Pulse 65   Temp 98.2 F (36.8 C) (Oral)   Resp 18   Ht 6' (1.829 m)   Wt 104.3 kg   SpO2 95%   BMI 31.19 kg/m  Physical Exam Vitals and nursing note reviewed.  Constitutional:      General: He is not in acute distress.    Appearance: Normal  appearance. He is not ill-appearing.  HENT:     Head: Normocephalic and atraumatic.     Nose: Nose normal.  Eyes:     General: No scleral icterus.    Extraocular Movements: Extraocular movements intact.     Conjunctiva/sclera: Conjunctivae normal.  Cardiovascular:     Rate and Rhythm: Normal rate and regular rhythm.     Pulses: Normal pulses.  Pulmonary:     Effort: Pulmonary effort is normal. No respiratory distress.     Breath sounds: Normal breath sounds. No wheezing.     Comments: Coarse lung sounds throughout Abdominal:     General: There is no distension.     Tenderness: There is no abdominal tenderness.  Musculoskeletal:        General: Normal range of motion.     Cervical back: Normal range of motion.  Skin:    General: Skin is warm and dry.  Neurological:     General: No focal deficit present.     Mental Status: He is alert. Mental status is at baseline.     ED Results / Procedures / Treatments   Labs (all labs ordered are listed, but only abnormal results are displayed) Labs Reviewed  RESP PANEL BY RT-PCR (FLU A&B, COVID) ARPGX2  CBC WITH DIFFERENTIAL/PLATELET  BASIC METABOLIC PANEL    EKG None  Radiology No  results found.  Procedures Procedures    Medications Ordered in ED Medications - No data to display  ED Course/ Medical Decision Making/ A&P                           Medical Decision Making Amount and/or Complexity of Data Reviewed Labs: ordered. Radiology: ordered.   Medical Decision Making / ED Course   This patient presents to the ED for concern of productive cough, this involves an extensive number of treatment options, and is a complaint that carries with it a high risk of complications and morbidity.  The differential diagnosis includes pneumonia, viral URI, sinusitis  MDM: 71 year old male with past medical history as noted above presents today for evaluation of productive cough ongoing for about 4 days.  Lung sounds are coarse  on exam.  We will evaluate with chest x-ray.  COVID and flu are negative.  Chest x-ray shows no acute cardiopulmonary process.  Maintaining O2 sats on room air without difficulty.  Denies chest pain.  Without peripheral edema.  Exam most consistent with bronchitis.  Will prescribe albuterol to use as needed, prednisone.  Discussed use of guaifenesin.  Strict return precautions discussed.  Discussed follow-up with PCP.  He states he has an appointment scheduled for upcoming Tuesday.  Patient is appropriate for discharge.  Discharged in stable condition.  Return precautions discussed.  Blood pressure is elevated.  Discussed keeping a blood pressure diary that he can review with his PCP.  Patient discussed with attending.   Additional history obtained: -Additional history obtained from chart review which confirms patient has history of SVT and is on Cardizem -External records from outside source obtained and reviewed including: Chart review including previous notes, labs, imaging, consultation notes   Lab Tests: -I ordered, reviewed, and interpreted labs.   The pertinent results include:   Labs Reviewed  RESP PANEL BY RT-PCR (FLU A&B, COVID) ARPGX2      EKG  EKG Interpretation  Date/Time:    Ventricular Rate:    PR Interval:    QRS Duration:   QT Interval:    QTC Calculation:   R Axis:     Text Interpretation:           Imaging Studies ordered: I ordered imaging studies including chest x-ray I independently visualized and interpreted imaging. I agree with the radiologist interpretation   Medicines ordered and prescription drug management: No orders of the defined types were placed in this encounter.   -I have reviewed the patients home medicines and have made adjustments as needed  Reevaluation: After the interventions noted above, I reevaluated the patient and found that they have :stayed the same Patient remained without acute distress throughout his emergency room stay.   Maintaining O2 sats on room air. Co morbidities that complicate the patient evaluation  Past Medical History:  Diagnosis Date   Arthritis    Hypertension    Sinus tachycardia     Dispostion: Patient is appropriate for discharge.  Discharged in stable condition.  Return precautions discussed.  Patient to follow-up with PCP. No indication for additional work-up or admission at this time.  Final Clinical Impression(s) / ED Diagnoses Final diagnoses:  Bronchitis  Elevated blood pressure reading    Rx / DC Orders ED Discharge Orders          Ordered    predniSONE (DELTASONE) 10 MG tablet  Daily        05/08/22 1830  azithromycin (ZITHROMAX Z-PAK) 250 MG tablet  Daily        05/08/22 1830    albuterol (VENTOLIN HFA) 108 (90 Base) MCG/ACT inhaler  Every 6 hours PRN        05/08/22 1830              Evlyn Courier, PA-C 05/08/22 1832    Charlesetta Shanks, MD 05/11/22 1250

## 2022-05-08 NOTE — ED Notes (Signed)
Reviewed discharge instructions, recommendations and medications with patient. Questions answered . States understanding. Pt ambulatory for discharge

## 2022-05-08 NOTE — ED Notes (Signed)
Patient transported to X-ray 

## 2022-05-08 NOTE — Discharge Instructions (Addendum)
Your work-up today is overall reassuring.  You likely have bronchitis.  I have sent prednisone into the pharmacy for you along with a Z-Pak.  Have also given you an albuterol inhaler that you can use for shortness of breath or wheezing if you develop any.  For any concerning symptoms such as chest pain, shortness of breath please return to the emergency room.  Chest x-ray did not show any evidence of pneumonia.  COVID and flu test were negative today.  Follow-up with your primary care provider.  Your blood pressure was elevated today.  I recommend you keep a blood pressure diary and follow-up with your primary care provider and they can make adjustments to your medicines as appropriate.  If you develop balance issues, chest pain, shortness of breath, or visual change please return to the emergency room.

## 2022-09-02 ENCOUNTER — Emergency Department (HOSPITAL_BASED_OUTPATIENT_CLINIC_OR_DEPARTMENT_OTHER)
Admission: EM | Admit: 2022-09-02 | Discharge: 2022-09-02 | Disposition: A | Discharge status: Home / Self Care | Payer: Medicare HMO | Attending: Emergency Medicine | Admitting: Emergency Medicine

## 2022-09-02 ENCOUNTER — Encounter (HOSPITAL_BASED_OUTPATIENT_CLINIC_OR_DEPARTMENT_OTHER): Payer: Self-pay | Admitting: Emergency Medicine

## 2022-09-02 DIAGNOSIS — T783XXA Angioneurotic edema, initial encounter: Secondary | ICD-10-CM | POA: Insufficient documentation

## 2022-09-02 DIAGNOSIS — R220 Localized swelling, mass and lump, head: Secondary | ICD-10-CM | POA: Diagnosis present

## 2022-09-02 MED ORDER — PREDNISONE 50 MG PO TABS
60.0000 mg | ORAL_TABLET | Freq: Once | ORAL | Status: AC
  Administered 2022-09-02: 60 mg via ORAL
  Filled 2022-09-02: qty 1

## 2022-09-02 MED ORDER — DIPHENHYDRAMINE HCL 25 MG PO CAPS
50.0000 mg | ORAL_CAPSULE | Freq: Once | ORAL | Status: AC
  Administered 2022-09-02: 50 mg via ORAL
  Filled 2022-09-02: qty 2

## 2022-09-02 MED ORDER — FAMOTIDINE 20 MG PO TABS
40.0000 mg | ORAL_TABLET | Freq: Once | ORAL | Status: AC
  Administered 2022-09-02: 40 mg via ORAL
  Filled 2022-09-02: qty 2

## 2022-09-02 MED ORDER — PREDNISONE 20 MG PO TABS: ORAL_TABLET | ORAL | 0 refills | Status: AC

## 2022-09-02 NOTE — Discharge Instructions (Addendum)
Make an appointment to have close follow-up with your primary care doctor within the next few days.  Take the prednisone as described.  You can use Benadryl at home as needed for swelling or itching.  Return to the emergency room if you have any worsening swelling, shortness of breath, difficulty opening your mouth or other worsening symptoms.

## 2022-09-02 NOTE — ED Notes (Signed)
No change noted in lip swelling.  Pt continues to deny tongue and throat swelling.

## 2022-09-02 NOTE — ED Notes (Signed)
Pt continues to have significant swelling in lower lip. Denies tongue or throat swelling.
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
## 2022-09-02 NOTE — ED Triage Notes (Signed)
Pt states his lower lip started swelling last night and this morning when he woke up his whole lower lip was swollen  Pt denies difficulty breathing or swallowing

## 2022-09-02 NOTE — ED Provider Notes (Signed)
[facility] Provider Note   CSN: [id number] Arrival date & time: 09/02/22  3382     History  Chief Complaint  Patient presents with   Oral Swelling    James Key is a 72 y.o. male.  Patient is a 72 year old male who presents with lower lip swelling.  He said it started around 1030 last night and got worse this morning.  He denies any pain to his lip.  No swelling to the back of his throat.  No difficulty swallowing or breathing.  No rash or itching.  No teeth pain.  He has not started any new medications.  He says only medicine he takes regularly is diltiazem for his blood pressure.  He he denies any new exposures.  He denies any history of food allergies.       Home Medications Prior to Admission medications   Medication Sig Start Date End Date Taking? Authorizing Provider  predniSONE (DELTASONE) 20 MG tablet 2 tabs po daily x 4 days 09/02/22  Yes Malvin Johns, MD  albuterol (VENTOLIN HFA) 108 (90 Base) MCG/ACT inhaler Inhale 1-2 puffs into the lungs every 6 (six) hours as needed for wheezing or shortness of breath. 05/08/22   Deatra Canter, Amjad, PA-C  azithromycin (ZITHROMAX Z-PAK) 250 MG tablet Take 1 tablet (250 mg total) by mouth daily. Take 2 tablets on day 1, and 1 tablet daily thereafter. 05/08/22   Evlyn Courier, PA-C  cyclobenzaprine (FLEXERIL) 5 MG tablet Take 1 tablet (5 mg total) by mouth 3 (three) times daily as needed for muscle spasms. 04/07/20   Drenda Freeze, MD  diltiazem (CARDIZEM CD) 360 MG 24 hr capsule Take 360 mg by mouth daily.    [provider]      Allergies    Patient has no known allergies.    Review of Systems   Review of Systems  Constitutional:  Negative for chills, diaphoresis, fatigue and fever.  HENT:  Positive for facial swelling. Negative for congestion, rhinorrhea and sneezing.   Eyes: Negative.   Respiratory:  Negative for cough, chest tightness and shortness of breath.    Cardiovascular:  Negative for chest pain.  Gastrointestinal:  Negative for abdominal pain, diarrhea, nausea and vomiting.  Musculoskeletal:  Negative for arthralgias.  Skin:  Negative for rash.  Neurological:  Negative for headaches.    Physical Exam Updated Vital Signs BP (!) 159/60 (BP Location: Right Arm)   Pulse 64   Temp 98.2 F (36.8 C) (Oral)   Resp 14   Ht 6' (1.829 m)   Wt 104.3 kg   SpO2 99%   BMI 31.19 kg/m  Physical Exam Constitutional:      Appearance: He is well-developed.  HENT:     Head: Normocephalic and atraumatic.     Mouth/Throat:     Comments: Patient has diffuse swelling to the lower lip.  No other oral swelling is noted.  It is not tender to palpation.  There is no areas of induration.  No dental pain or swelling around the teeth.  No trismus.  No elevation of the tongue.  No swelling to the posterior pharynx.  Uvula is midline. Eyes:     Pupils: Pupils are equal, round, and reactive to light.  Cardiovascular:     Rate and Rhythm: Normal rate and regular rhythm.     Heart sounds: Normal heart sounds.  Pulmonary:     Effort: Pulmonary effort is normal. No respiratory distress.  Breath sounds: Normal breath sounds. No wheezing or rales.  Chest:     Chest wall: No tenderness.  Abdominal:     General: Bowel sounds are normal.     Palpations: Abdomen is soft.     Tenderness: There is no abdominal tenderness. There is no guarding or rebound.  Musculoskeletal:        General: Normal range of motion.     Cervical back: Normal range of motion and neck supple.  Lymphadenopathy:     Cervical: No cervical adenopathy.  Skin:    General: Skin is warm and dry.     Findings: No rash.  Neurological:     Mental Status: He is alert and oriented to person, place, and time.     ED Results / Procedures / Treatments   Labs (all labs ordered are listed, but only abnormal results are displayed) Labs Reviewed - No data to display  EKG None  Radiology No  results found.  Procedures Procedures    Medications Ordered in ED Medications  predniSONE (DELTASONE) tablet 60 mg (60 mg Oral Given 09/02/22 0732)  diphenhydrAMINE (BENADRYL) capsule 50 mg (50 mg Oral Given 09/02/22 0732)  famotidine (PEPCID) tablet 40 mg (40 mg Oral Given 09/02/22 0732)    ED Course/ Medical Decision Making/ A&P                             Medical Decision Making Risk Prescription drug management.   Patient is a 72 year old man who presents with lower lip swelling.  He does not have any tenderness or suggestions of infection around the area.  It is localized only to the lower lip.  No airway involvement.  He is not on any ACE inhibitor's.  He is on diltiazem which on review is not known to cause this problem.  He was given a dose of prednisone, Benadryl and Pepcid.  He did not really have much change in his swelling but certainly no worsening after period of observation over 2 hours in the ED.  At this point given that he is not having any spreading or worsening of the symptoms, I feel that he can be safely discharged.  He was given a prescription for prednisone and advised that he can use Benadryl at home.  He was encouraged to have close follow-up with his PCP.  Strict return precautions were given.  {Final Clinical Impression(s) / ED Diagnoses Final diagnoses:  Angioedema, initial encounter    Rx / DC Orders ED Discharge Orders          Ordered    predniSONE (DELTASONE) 20 MG tablet        09/02/22 0920              Malvin Johns, MD 09/02/22 [phone]

## 2022-09-02 NOTE — ED Notes (Signed)
ED Provider at bedside. 

## 2022-09-27 DIAGNOSIS — S76112A Strain of left quadriceps muscle, fascia and tendon, initial encounter: Secondary | ICD-10-CM | POA: Insufficient documentation

## 2022-09-27 DIAGNOSIS — M25462 Effusion, left knee: Secondary | ICD-10-CM | POA: Insufficient documentation

## 2022-09-27 DIAGNOSIS — S8992XA Unspecified injury of left lower leg, initial encounter: Secondary | ICD-10-CM | POA: Diagnosis present

## 2022-09-27 DIAGNOSIS — W19XXXA Unspecified fall, initial encounter: Secondary | ICD-10-CM | POA: Insufficient documentation

## 2022-09-27 DIAGNOSIS — M7989 Other specified soft tissue disorders: Secondary | ICD-10-CM | POA: Insufficient documentation

## 2022-09-27 DIAGNOSIS — Y92009 Unspecified place in unspecified non-institutional (private) residence as the place of occurrence of the external cause: Secondary | ICD-10-CM | POA: Diagnosis not present

## 2022-09-27 NOTE — ED Triage Notes (Signed)
Pt was on porch at home and had mechanical fall and injured LT knee. Pt reports knot to that area. Pt also fell on the curb outside of the ED and re injured his LT knee. Pt states he did not see the curb. Pt feels like joint is out of place and is unable to lift leg at this time. Sensation to LT foot intact.

## 2022-09-28 ENCOUNTER — Emergency Department (HOSPITAL_BASED_OUTPATIENT_CLINIC_OR_DEPARTMENT_OTHER): Payer: Medicare HMO

## 2022-09-28 ENCOUNTER — Encounter (HOSPITAL_BASED_OUTPATIENT_CLINIC_OR_DEPARTMENT_OTHER): Payer: Self-pay

## 2022-09-28 ENCOUNTER — Other Ambulatory Visit: Payer: Self-pay

## 2022-09-28 ENCOUNTER — Emergency Department (HOSPITAL_BASED_OUTPATIENT_CLINIC_OR_DEPARTMENT_OTHER)
Admission: EM | Admit: 2022-09-28 | Discharge: 2022-09-28 | Disposition: A | Discharge status: Home / Self Care | Payer: No Typology Code available for payment source | Attending: Emergency Medicine | Admitting: Emergency Medicine

## 2022-09-28 DIAGNOSIS — M25462 Effusion, left knee: Secondary | ICD-10-CM

## 2022-09-28 DIAGNOSIS — S76112A Strain of left quadriceps muscle, fascia and tendon, initial encounter: Secondary | ICD-10-CM

## 2022-09-28 MED ORDER — DICLOFENAC SODIUM ER 100 MG PO TB24: 100.0000 mg | ORAL_TABLET | Freq: Every day | ORAL | 0 refills | Status: AC

## 2022-09-28 MED ORDER — KETOROLAC TROMETHAMINE 30 MG/ML IJ SOLN
30.0000 mg | Freq: Once | INTRAMUSCULAR | Status: DC
  Filled 2022-09-28: qty 1

## 2022-09-28 MED ORDER — KETOROLAC TROMETHAMINE 30 MG/ML IJ SOLN
30.0000 mg | Freq: Once | INTRAMUSCULAR | Status: AC
  Administered 2022-09-28: 30 mg via INTRAMUSCULAR

## 2022-09-28 NOTE — ED Provider Notes (Signed)
[facility] Provider Note   CSN: [id number] Arrival date & time: 09/27/22  2335     History  Chief Complaint  Patient presents with   Knee Injury    James Key is a 72 y.o. male.  The history is provided by the patient.  Knee Pain Location:  Knee Time since incident: hours. Injury: yes   Mechanism of injury: fall   Mechanism of injury comment:  Fall x 2 Knee location:  L knee Pain details:    Quality:  Aching   Radiates to:  Does not radiate   Severity:  Moderate   Onset quality:  Sudden   Timing:  Constant   Progression:  Unchanged Chronicity:  New Relieved by:  Nothing Worsened by:  Nothing Ineffective treatments:  None tried Associated symptoms: no fever   Risk factors: no concern for non-accidental trauma        Home Medications Prior to Admission medications   Medication Sig Start Date End Date Taking? Authorizing Provider  Diclofenac Sodium CR 100 MG 24 hr tablet Take 1 tablet (100 mg total) by mouth daily. 09/28/22  Yes Ninah Moccio, MD  albuterol (VENTOLIN HFA) 108 (90 Base) MCG/ACT inhaler Inhale 1-2 puffs into the lungs every 6 (six) hours as needed for wheezing or shortness of breath. 05/08/22   Deatra Canter, Amjad, PA-C  azithromycin (ZITHROMAX Z-PAK) 250 MG tablet Take 1 tablet (250 mg total) by mouth daily. Take 2 tablets on day 1, and 1 tablet daily thereafter. 05/08/22   Evlyn Courier, PA-C  cyclobenzaprine (FLEXERIL) 5 MG tablet Take 1 tablet (5 mg total) by mouth 3 (three) times daily as needed for muscle spasms. 04/07/20   Drenda Freeze, MD  diltiazem (CARDIZEM CD) 360 MG 24 hr capsule Take 360 mg by mouth daily.    [provider]  predniSONE (DELTASONE) 20 MG tablet 2 tabs po daily x 4 days 09/02/22   Malvin Johns, MD      Allergies    Patient has no known allergies.    Review of Systems   Review of Systems  Constitutional:  Negative for fever.  HENT:  Negative for facial swelling.    Eyes:  Negative for redness.  Respiratory:  Negative for wheezing and stridor.   Cardiovascular:  Negative for chest pain.  Gastrointestinal:  Negative for vomiting.  Musculoskeletal:  Positive for arthralgias.  All other systems reviewed and are negative.   Physical Exam Updated Vital Signs BP (!) 174/63   Pulse 78   Temp 98.3 F (36.8 C) (Oral)   Resp 16   Ht '6\' 1"'$  (1.854 m)   Wt 104.3 kg   SpO2 99%   BMI 30.34 kg/m  Physical Exam Vitals and nursing note reviewed.  Constitutional:      General: He is not in acute distress.    Appearance: Normal appearance. He is well-developed. He is not diaphoretic.  HENT:     Head: Normocephalic and atraumatic.  Eyes:     Conjunctiva/sclera: Conjunctivae normal.     Pupils: Pupils are equal, round, and reactive to light.  Cardiovascular:     Rate and Rhythm: Normal rate and regular rhythm.  Pulmonary:     Effort: Pulmonary effort is normal.     Breath sounds: Normal breath sounds. No wheezing or rales.  Abdominal:     General: Bowel sounds are normal.     Palpations: Abdomen is soft.     Tenderness: There is no abdominal  tenderness. There is no guarding or rebound.  Musculoskeletal:     Cervical back: Normal range of motion and neck supple.     Left knee: Effusion present. No erythema, ecchymosis, bony tenderness or crepitus. Decreased range of motion. No LCL laxity, MCL laxity, ACL laxity or PCL laxity.Normal patellar mobility. Normal pulse.     Instability Tests: Medial McMurray test negative and lateral McMurray test negative.     Left lower leg: Normal.     Left ankle: Normal.     Left Achilles Tendon: Normal.       Legs:     Comments: No patella alta or baja can flex with pain,  Can hold straight elevated x 5 seconds.  Slight bulge superiorly and laterally to patella.    Skin:    General: Skin is warm and dry.     Capillary Refill: Capillary refill takes less than 2 seconds.  Neurological:     General: No focal deficit  present.     Mental Status: He is alert and oriented to person, place, and time.     Deep Tendon Reflexes: Reflexes normal.  Psychiatric:        Mood and Affect: Mood normal.        Behavior: Behavior normal.     ED Results / Procedures / Treatments   Labs (all labs ordered are listed, but only abnormal results are displayed) Labs Reviewed - No data to display  EKG None  Radiology CT Knee Left Wo Contrast  Result Date: 09/28/2022 CLINICAL DATA:  Knee trauma, dislocation suspected. EXAM: CT OF THE LEFT KNEE WITHOUT CONTRAST TECHNIQUE: Multidetector CT imaging of the left knee was performed according to the standard protocol. Multiplanar CT image reconstructions were also generated. RADIATION DOSE REDUCTION: This exam was performed according to the departmental dose-optimization program which includes automated exposure control, adjustment of the mA and/or kV according to patient size and/or use of iterative reconstruction technique. COMPARISON:  09/28/2022. FINDINGS: Bones/Joint/Cartilage No acute fracture or dislocation. Mild tricompartmental degenerative changes are present. Enthesopathic changes are present at the patella. A moderate joint effusion is noted. Ligaments Suboptimally assessed by CT. Muscles and Tendons No intramuscular hematoma. Soft tissues Subcutaneous soft tissue edema is noted anterior to the patella. Dystrophic calcifications are noted in the soft tissues superior to the patella. Vascular calcifications are noted in the soft tissues. IMPRESSION: 1. No acute fracture or dislocation. 2. Mild tricompartmental degenerative changes. 3. Moderate joint effusion. Electronically Signed   By: Brett Fairy M.D.   On: 09/28/2022 04:18   DG Hip Unilat W or Wo Pelvis 2-3 Views Left  Result Date: 09/28/2022 CLINICAL DATA:  Fall EXAM: DG HIP (WITH OR WITHOUT PELVIS) 2-3V LEFT COMPARISON:  None Available. FINDINGS: There is no evidence of hip fracture or dislocation. There is no evidence  of arthropathy or other focal bone abnormality. IMPRESSION: Negative. Electronically Signed   By: Rolm Baptise M.D.   On: 09/28/2022 00:37   DG Knee Complete 4 Views Left  Result Date: 09/28/2022 CLINICAL DATA:  Fall, left knee pain EXAM: LEFT KNEE - COMPLETE 4+ VIEW COMPARISON:  None FINDINGS: No acute bony abnormality. Specifically, no fracture, subluxation, or dislocation. No joint effusion. Joint spaces maintained. IMPRESSION: No acute bony abnormality. Electronically Signed   By: Rolm Baptise M.D.   On: 09/28/2022 00:37    Procedures Procedures    Medications Ordered in ED Medications  ketorolac (TORADOL) 30 MG/ML injection 30 mg (30 mg Intramuscular Given 09/28/22 0351)  ED Course/ Medical Decision Making/ A&P                             Medical Decision Making Patient with fall x 2 and unable to bear weight on left knee   Amount and/or Complexity of Data Reviewed External Data Reviewed: notes.    Details: Previous notes reviewed  Radiology: ordered and independent interpretation performed.    Details: Effusion on CT by me  Discussion of management or test interpretation with external provider(s): Dr. Doran Durand, immobilize and follow up in office   Risk Prescription drug management. Risk Details: Given pain and bulge I suspect there is a partial tear of quadriceps tendon, though no patella alta.  Immobilized and crutches and medication provided.  Follow up with orthopedics for ongoing care.  Strict retunr     Final Clinical Impression(s) / ED Diagnoses Final diagnoses:  Effusion of left knee  Rupture of left quadriceps muscle, initial encounter   Return for intractable cough, coughing up blood, fevers > 100.4 unrelieved by medication, shortness of breath, intractable vomiting, chest pain, shortness of breath, weakness, numbness, changes in speech, facial asymmetry, abdominal pain, passing out, Inability to tolerate liquids or food, cough, altered mental status or any  concerns. No signs of systemic illness or infection. The patient is nontoxic-appearing on exam and vital signs are within normal limits.  I have reviewed the triage vital signs and the nursing notes. Pertinent labs & imaging results that were available during my care of the patient were reviewed by me and considered in my medical decision making (see chart for details). After history, exam, and medical workup I feel the patient has been appropriately medically screened and is safe for discharge home. Pertinent diagnoses were discussed with the patient. Patient was given return precautions.  Rx / DC Orders ED Discharge Orders          Ordered    Diclofenac Sodium CR 100 MG 24 hr tablet  Daily        09/28/22 0423              Coron Rossano, MD 09/28/22 [id number]

## 2022-09-28 NOTE — ED Notes (Signed)
Patient reports he is unable to remove pants due to the pain.

## 2023-01-02 ENCOUNTER — Emergency Department (HOSPITAL_BASED_OUTPATIENT_CLINIC_OR_DEPARTMENT_OTHER)
Admission: EM | Admit: 2023-01-02 | Discharge: 2023-01-02 | Disposition: A | Discharge status: Home / Self Care | Payer: Medicare HMO | Attending: Emergency Medicine | Admitting: Emergency Medicine

## 2023-01-02 ENCOUNTER — Emergency Department (HOSPITAL_BASED_OUTPATIENT_CLINIC_OR_DEPARTMENT_OTHER): Payer: Medicare HMO

## 2023-01-02 ENCOUNTER — Encounter (HOSPITAL_BASED_OUTPATIENT_CLINIC_OR_DEPARTMENT_OTHER): Payer: Self-pay

## 2023-01-02 ENCOUNTER — Other Ambulatory Visit: Payer: Self-pay

## 2023-01-02 DIAGNOSIS — J069 Acute upper respiratory infection, unspecified: Secondary | ICD-10-CM

## 2023-01-02 DIAGNOSIS — R059 Cough, unspecified: Secondary | ICD-10-CM | POA: Diagnosis present

## 2023-01-02 DIAGNOSIS — J028 Acute pharyngitis due to other specified organisms: Secondary | ICD-10-CM | POA: Diagnosis not present

## 2023-01-02 DIAGNOSIS — Z20822 Contact with and (suspected) exposure to covid-19: Secondary | ICD-10-CM | POA: Insufficient documentation

## 2023-01-02 DIAGNOSIS — B9789 Other viral agents as the cause of diseases classified elsewhere: Secondary | ICD-10-CM | POA: Diagnosis not present

## 2023-01-02 LAB — SARS CORONAVIRUS 2 BY RT PCR: SARS Coronavirus 2 by RT PCR: NEGATIVE

## 2023-01-02 MED ORDER — FLUTICASONE PROPIONATE 50 MCG/ACT NA SUSP: 1.0000 | Freq: Every day | NASAL | 0 refills | Status: AC

## 2023-01-02 NOTE — ED Provider Notes (Signed)
[facility] Provider Note   CSN: [id number] Arrival date & time: 01/02/23  1740     History Chief Complaint  Patient presents with   Cough    HPI James Key is a 72 y.o. male presenting for chief complaint of cough.  He denies fevers chills nausea vomiting shortness of breath.  States he been coughing up a substantial nonmaterial the past 72 hours.  No known sick contacts.  Otherwise ambulatory tolerating p.o. intake on arrival.   Patient's recorded medical, surgical, social, medication list and allergies were reviewed in the Snapshot window as part of the initial history.   Review of Systems   Review of Systems  Constitutional:  Negative for chills and fever.  HENT:  Positive for rhinorrhea. Negative for ear pain and sore throat.   Eyes:  Negative for pain and visual disturbance.  Respiratory:  Positive for cough. Negative for shortness of breath.   Cardiovascular:  Negative for chest pain and palpitations.  Gastrointestinal:  Negative for abdominal pain and vomiting.  Genitourinary:  Negative for dysuria and hematuria.  Musculoskeletal:  Negative for arthralgias and back pain.  Skin:  Negative for color change and rash.  Neurological:  Negative for seizures and syncope.  All other systems reviewed and are negative.   Physical Exam Updated Vital Signs BP (!) 153/52 (BP Location: Right Arm)   Pulse 65   Temp 98.4 F (36.9 C)   Resp 17   Ht 6' (1.829 m)   Wt 104.3 kg   SpO2 97%   BMI 31.19 kg/m  Physical Exam Vitals and nursing note reviewed.  Constitutional:      General: He is not in acute distress.    Appearance: He is well-developed.  HENT:     Head: Normocephalic and atraumatic.  Eyes:     Conjunctiva/sclera: Conjunctivae normal.  Cardiovascular:     Rate and Rhythm: Normal rate and regular rhythm.     Heart sounds: No murmur heard. Pulmonary:     Effort: Pulmonary effort is normal. No respiratory  distress.     Breath sounds: Normal breath sounds.  Abdominal:     Palpations: Abdomen is soft.     Tenderness: There is no abdominal tenderness.  Musculoskeletal:        General: No swelling.     Cervical back: Neck supple.  Skin:    General: Skin is warm and dry.     Capillary Refill: Capillary refill takes less than 2 seconds.  Neurological:     Mental Status: He is alert.  Psychiatric:        Mood and Affect: Mood normal.      ED Course/ Medical Decision Making/ A&P    Procedures Procedures   Medications Ordered in ED Medications - No data to display Medical Decision Making:   James Key is a 72 y.o. male who presented to the ED today with subjective fever, cough, congestion detailed above.    Complete initial physical exam performed, notably the patient  was hemodynamically stable in no acute distress.  Posterior oropharynx illuminated and without obvious swelling or deformity.  Patient is without neck stiffness.    Reviewed and confirmed nursing documentation for past medical history, family history, social history.    Initial Assessment:   With the patient's presentation of fever cough congestion, most likely diagnosis is developing viral upper respiratory infection. Other diagnoses were considered including (but not limited to) peritonsillar abscess, retropharyngeal abscess, pneumonia. These are  considered less likely due to history of present illness and physical exam findings.   This is most consistent with an acute life/limb threatening illness complicated by underlying chronic conditions. Considered meningitis, however patient's symptoms, vital signs, physical exam findings including lack of meningismus seem grossly less consistent at this time. Initial Plan:  Viral screening including COVID/flu testing to evaluate for common viral etiologies that need to be tracked CXR to evaluate for structural/infectious intrathoracic pathology.  Empiric treatment with  antipyretics including acetaminophen in ambulatory setting Objective evaluation as below reviewed   Initial Study Results:   Laboratory  All laboratory results reviewed without evidence of clinically relevant pathology.   Radiology:  All images reviewed independently. Agree with radiology report at this time.   DG Chest Portable 1 View  Final Result         Final Assessment and Plan:   On reassessment, patient is ambulatory tolerating p.o. intake in no acute distress.   Patient's COVID test is negative. Patient is currently stable for outpatient care and management with no indication for hospitalization or transfer at this time.  Discussed all findings with patient expressed understanding.  Disposition:  Based on the above findings, I believe patient is stable for discharge.    Patient/family educated about specific return precautions for given chief complaint and symptoms.  Patient/family educated about follow-up with PCP.     Patient/family expressed understanding of return precautions and need for follow-up. Patient spoken to regarding all imaging and laboratory results and appropriate follow up for these results. All education provided in verbal form with additional information in written form. Time was allowed for answering of patient questions. Patient discharged.    Emergency Department Medication Summary:   Medications - No data to display         Clinical Impression:  1. Viral URI with cough      Discharge    Clinical Impression: No diagnosis found.   Data Unavailable   Final Clinical Impression(s) / ED Diagnoses Final diagnoses:  None    Rx / DC Orders ED Discharge Orders     None         Glyn Ade, MD 01/02/23 2310

## 2023-01-02 NOTE — ED Triage Notes (Signed)
Cough and congestion x 1 week
